# Patient Record
Sex: Male | Born: 1990 | Race: White | Hispanic: No | Marital: Single
Health system: Southern US, Community
[De-identification: ages and names within clinical notes are randomized; demographics above are authoritative.]

---

## 2016-12-27 ENCOUNTER — Other Ambulatory Visit: Payer: Self-pay | Admitting: Ophthalmology

## 2016-12-27 ENCOUNTER — Ambulatory Visit
Admission: RE | Admit: 2016-12-27 | Discharge: 2016-12-27 | Disposition: A | Discharge status: Home / Self Care | Payer: BLUE CROSS/BLUE SHIELD | Source: Ambulatory Visit | Attending: Ophthalmology | Admitting: Ophthalmology

## 2016-12-27 DIAGNOSIS — Z09 Encounter for follow-up examination after completed treatment for conditions other than malignant neoplasm: Secondary | ICD-10-CM

## 2018-01-26 ENCOUNTER — Other Ambulatory Visit: Payer: Self-pay | Admitting: Ophthalmology

## 2018-01-26 DIAGNOSIS — H4710 Unspecified papilledema: Secondary | ICD-10-CM

## 2018-01-29 ENCOUNTER — Ambulatory Visit
Admission: RE | Admit: 2018-01-29 | Discharge: 2018-01-29 | Disposition: A | Discharge status: Home / Self Care | Payer: BLUE CROSS/BLUE SHIELD | Source: Ambulatory Visit | Attending: Ophthalmology | Admitting: Ophthalmology

## 2018-01-29 DIAGNOSIS — H4710 Unspecified papilledema: Secondary | ICD-10-CM

## 2018-01-29 MED ORDER — GADOBENATE DIMEGLUMINE 529 MG/ML IV SOLN
17.0000 mL | Freq: Once | INTRAVENOUS | Status: AC | PRN
  Administered 2018-01-29: 17 mL via INTRAVENOUS

## 2018-02-01 ENCOUNTER — Other Ambulatory Visit: Payer: BLUE CROSS/BLUE SHIELD

## 2018-02-15 ENCOUNTER — Other Ambulatory Visit: Payer: Self-pay | Admitting: Ophthalmology

## 2018-02-15 DIAGNOSIS — H4710 Unspecified papilledema: Secondary | ICD-10-CM

## 2018-02-22 ENCOUNTER — Ambulatory Visit
Admission: RE | Admit: 2018-02-22 | Discharge: 2018-02-22 | Disposition: A | Discharge status: Home / Self Care | Payer: BLUE CROSS/BLUE SHIELD | Source: Ambulatory Visit | Attending: Ophthalmology | Admitting: Ophthalmology

## 2018-02-22 VITALS — BP 140/81 | HR 63

## 2018-02-22 DIAGNOSIS — H4710 Unspecified papilledema: Secondary | ICD-10-CM

## 2018-02-22 LAB — PROTEIN, CSF: Total Protein, CSF: 38 mg/dL (ref 15–45)

## 2018-02-22 LAB — CSF CELL COUNT WITH DIFFERENTIAL
RBC COUNT CSF: 1 {cells}/uL (ref 0–10)
WBC, CSF: 0 cells/uL (ref 0–5)

## 2018-02-22 LAB — GLUCOSE, CSF: Glucose, CSF: 58 mg/dL (ref 40–80)

## 2018-02-22 NOTE — Discharge Instructions (Signed)

## 2019-05-28 ENCOUNTER — Other Ambulatory Visit: Payer: Self-pay | Admitting: Ophthalmology

## 2019-05-28 DIAGNOSIS — H4710 Unspecified papilledema: Secondary | ICD-10-CM

## 2019-06-13 ENCOUNTER — Other Ambulatory Visit: Payer: Self-pay

## 2019-06-13 ENCOUNTER — Other Ambulatory Visit: Payer: BLUE CROSS/BLUE SHIELD

## 2019-06-13 ENCOUNTER — Ambulatory Visit
Admission: RE | Admit: 2019-06-13 | Discharge: 2019-06-13 | Disposition: A | Discharge status: Home / Self Care | Payer: Managed Care, Other (non HMO) | Source: Ambulatory Visit | Attending: Ophthalmology | Admitting: Ophthalmology

## 2019-06-13 DIAGNOSIS — H4710 Unspecified papilledema: Secondary | ICD-10-CM

## 2019-06-13 MED ORDER — GADOBENATE DIMEGLUMINE 529 MG/ML IV SOLN
17.0000 mL | Freq: Once | INTRAVENOUS | Status: AC | PRN
  Administered 2019-06-13: 17 mL via INTRAVENOUS

## 2019-10-26 IMAGING — MR MRI HEAD WITHOUT AND WITH CONTRAST
13 series · 48 of 48 positions shown · IV contrast (multihance)
Comparison: Brain MRI 01/30/2018

CLINICAL DATA: 27-year-old male with recurrent papilledema, query
elevated intracranial pressure. Left optic neuritis x1 month.

EXAM:
MRI HEAD WITHOUT AND WITH CONTRAST
TECHNIQUE: Multiplanar, multiecho pulse sequences of the brain and surrounding
structures were obtained without and with intravenous contrast.
CONTRAST:  17mL MULTIHANCE GADOBENATE DIMEGLUMINE 529 MG/ML IV SOLN

[Series 2: T1 · sagittal · 5.0mm · 0.45mm/px · 2 of 23 slices shown]
[im 1/23]
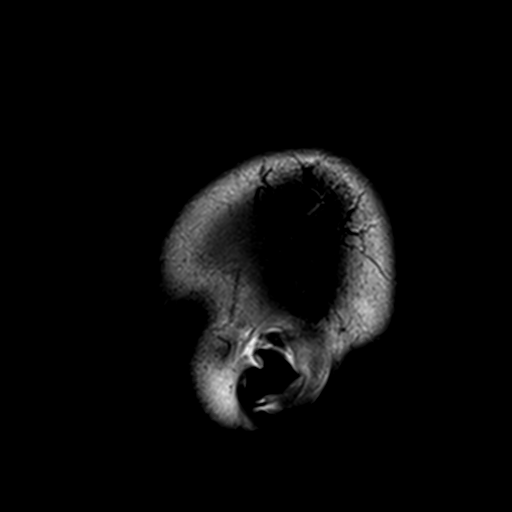
[im 23/23]
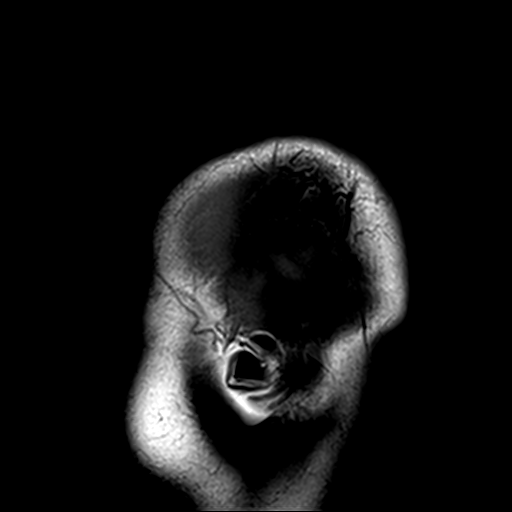

[Series 3: DWI · axial · 3.0mm · 1.80mm/px · z∈[-52,+107]mm · 7 of 108 slices shown (1 of 4)]
[im 1/108]
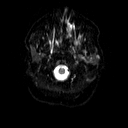
[im 18/108]
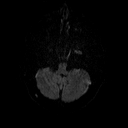
[im 36/108]
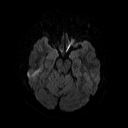
[im 54/108]
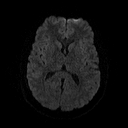
[im 72/108]
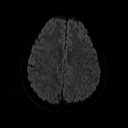
[im 90/108]
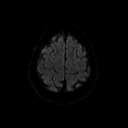
[im 108/108]
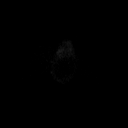

[Series 4: DWI · axial · 3.0mm · 1.80mm/px · z∈[-52,+107]mm · 3 of 53 slices shown (2 of 4)]
[im 1/53]
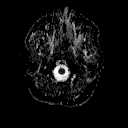
[im 27/53]
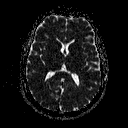
[im 53/53]
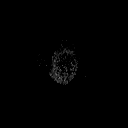

[Series 5: DWI · coronal · 5.0mm · 1.80mm/px · 5 of 76 slices shown (3 of 4)]
[im 1/76]
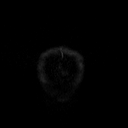
[im 19/76]
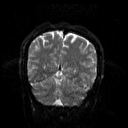
[im 38/76]
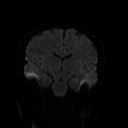
[im 57/76]
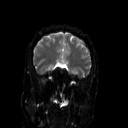
[im 76/76]
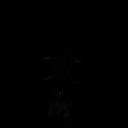

[Series 6: DWI · coronal · 5.0mm · 1.80mm/px · 2 of 38 slices shown (4 of 4)]
[im 1/38]
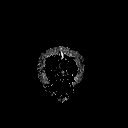
[im 38/38]
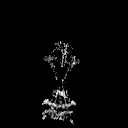

[Series 7: T2 · axial · 5.0mm · 0.51mm/px · z∈[-58,+104]mm · 2 of 25 slices shown (1 of 2)]
[im 1/25]
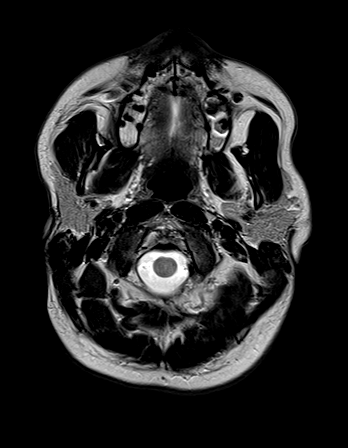
[im 25/25]
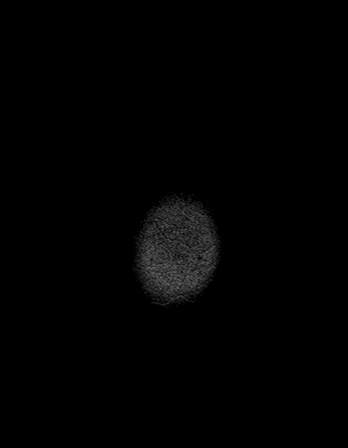

[Series 8: FLAIR · axial · 3.0mm · 0.45mm/px · z∈[-50,+107]mm · 2 of 35 slices shown]
[im 1/35]
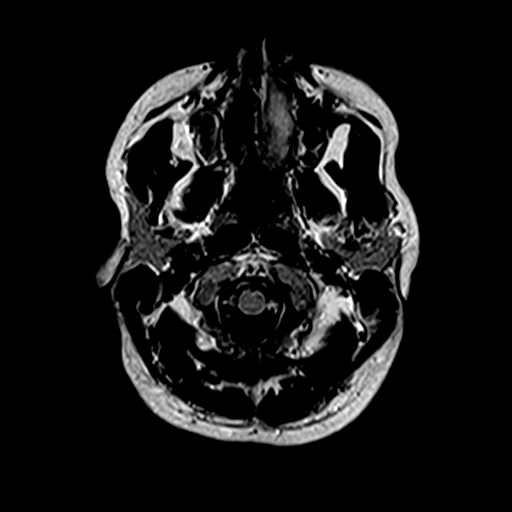
[im 35/35]
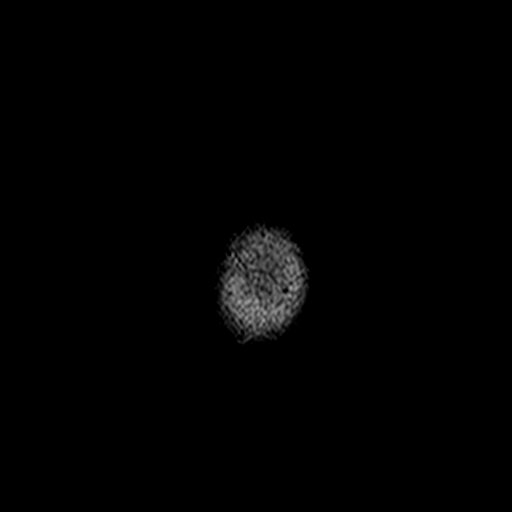

[Series 10: swi_images · axial · 4.0mm · 0.90mm/px · z∈[-56,+99]mm · 2 of 40 slices shown]
[im 1/40]
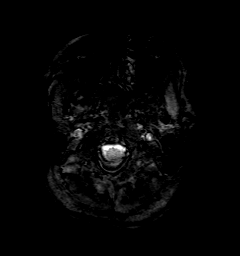
[im 40/40]
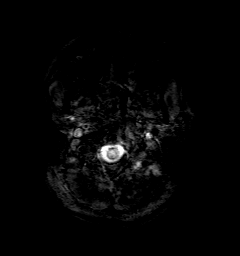

[Series 11: t1_mpr_tra · axial · 1.0mm · 0.75mm/px · z∈[-46,+101]mm · 9 of 144 slices shown (1 of 2)]
[im 1/144]
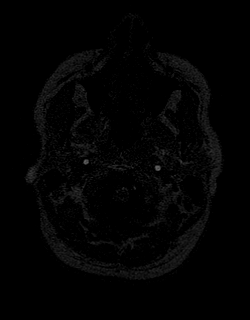
[im 18/144]
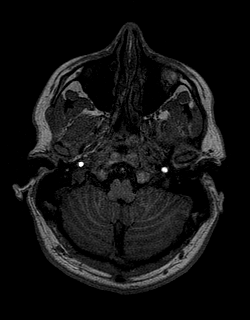
[im 36/144]
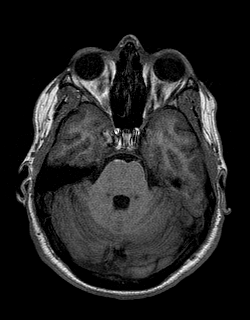
[im 54/144]
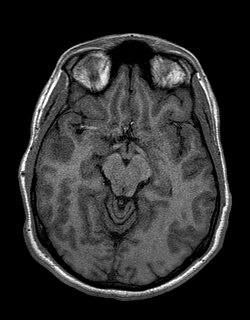
[im 72/144]
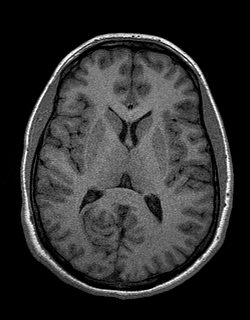
[im 90/144]
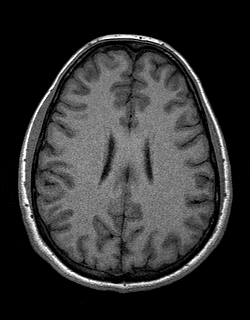
[im 108/144]
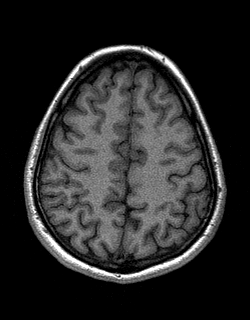
[im 126/144]
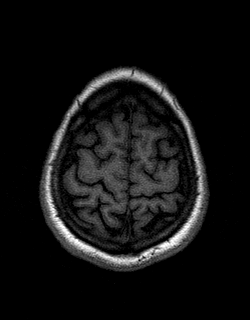
[im 144/144]
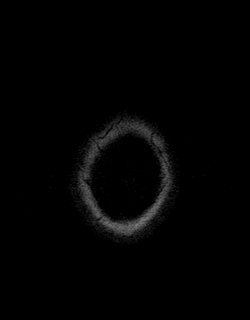

[Series 12: T2 · coronal · 5.0mm · 0.45mm/px · 2 of 30 slices shown (2 of 2)]
[im 1/30]
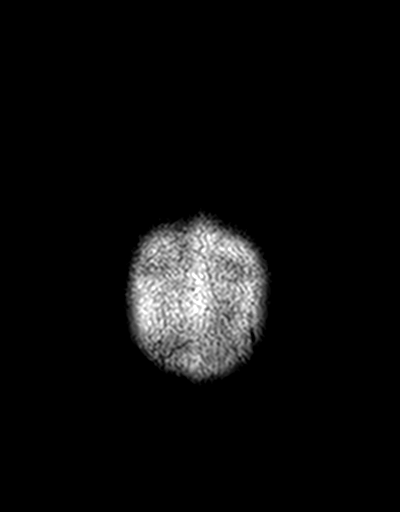
[im 30/30]
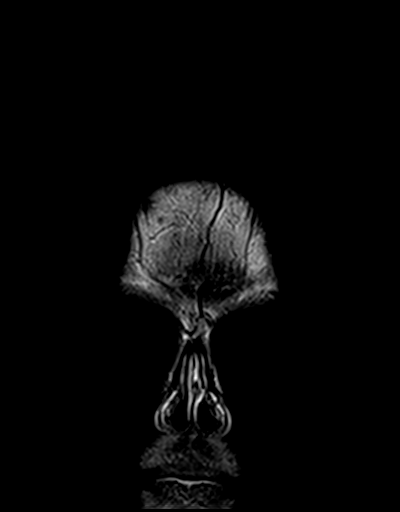

[Series 13: t1_mpr_tra · axial · 1.0mm · 0.75mm/px · z∈[-46,+101]mm · 9 of 144 slices shown (2 of 2)]
[im 1/144]
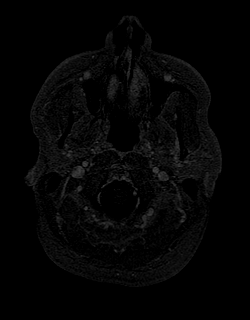
[im 18/144]
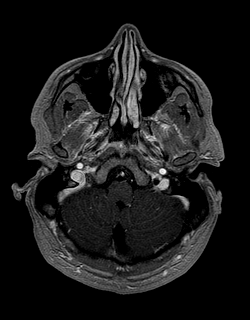
[im 36/144]
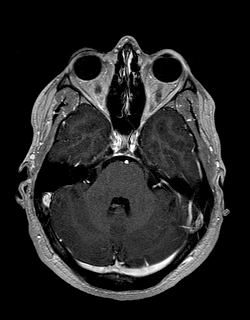
[im 54/144]
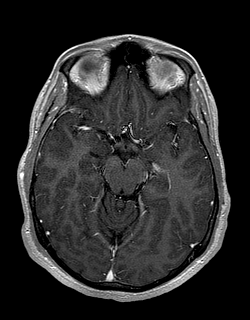
[im 72/144]
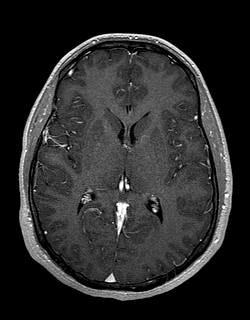
[im 90/144]
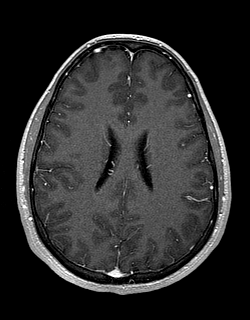
[im 108/144]
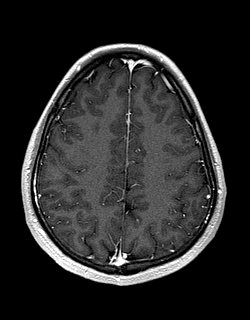
[im 126/144]
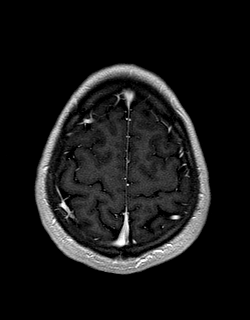
[im 144/144]
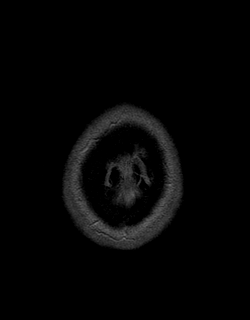

[Series 14: post cor · coronal · 5.0mm · 0.45mm/px · 2 of 30 slices shown]
[im 1/30]
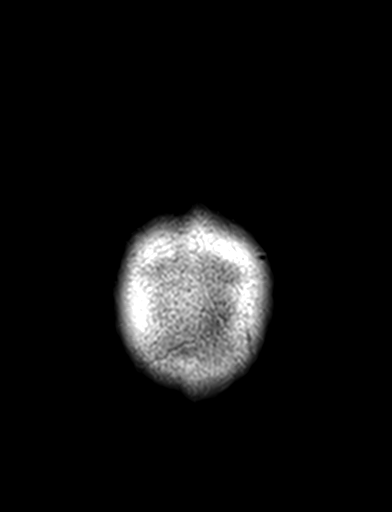
[im 30/30]
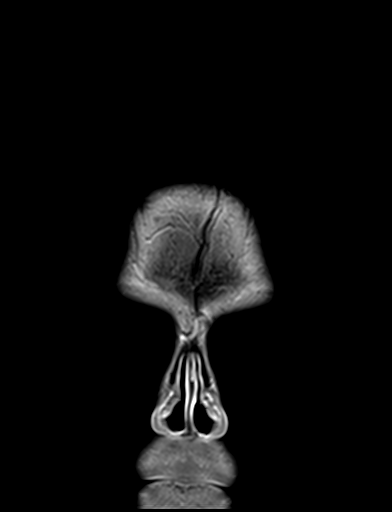

[Series 15: post sag (optional · sagittal · 5.0mm · 0.45mm/px · 1 of 24 slices shown]
[im 1/24]
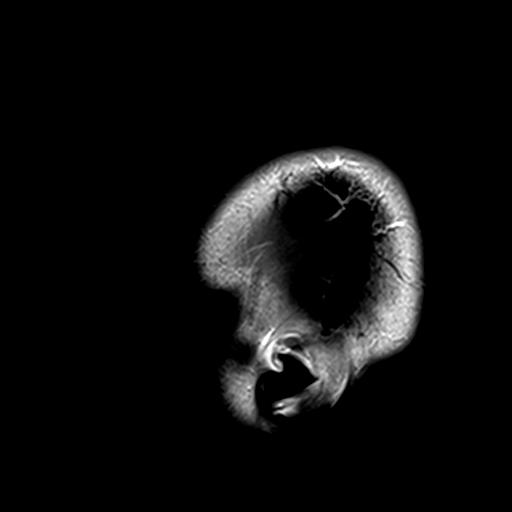

[48 of 48 positions shown; findings below may reference images not displayed]

FINDINGS: Brain: Cerebral volume is stable and normal. Normal basilar
cisterns. No restricted diffusion to suggest acute infarction. No
midline shift, mass effect, evidence of mass lesion,
ventriculomegaly, extra-axial collection or acute intracranial
hemorrhage. Cervicomedullary junction within normal limits.

Gray and white matter signal remains normal. No encephalomalacia or
chronic blood products.

No abnormal enhancement identified.  No dural thickening.

Somewhat partially empty sella is unchanged (series 2, image 12).

Vascular: Major intracranial vascular flow voids are stable and
within normal limits, the left vertebral artery is dominant. The
major dural venous sinuses are enhancing and appear to be patent.

Skull and upper cervical spine: Visualized bone marrow signal is
within normal limits. Negative visible cervical spine.

Sinuses/Orbits: T2 detail of the globes is suboptimal today compared
to that in 5650, but faint papilledema remains apparent on
postcontrast series 13, image 40. Symmetric appearing optic nerve
root sleeves. Other intraorbital soft tissues appear normal. No
asymmetric enhancement of the left optic nerve is evident on these
routine images. Chiasm is normal.

Paranasal sinuses and mastoids are stable and well pneumatized.

Other: Visible internal auditory structures appear normal. Scalp and
face soft tissues appear negative.
IMPRESSION: 1. Normal MRI appearance of the brain aside from a somewhat
partially empty sella appearance. Consider idiopathic intracranial
hypertension (pseudotumor cerebri) in this clinical setting.
2. Persistent evidence of left papilledema, but otherwise normal
appearance of the orbits on these routine images. Dedicated Orbit
MRI (without and with contrast) would be most sensitive for optic
neuritis.
